# Patient Record
Sex: Female | Born: 1975 | Hispanic: Yes | Marital: Married
Health system: Southern US, Community
[De-identification: ages and names within clinical notes are randomized; demographics above are authoritative.]

---

## 2008-08-16 ENCOUNTER — Ambulatory Visit (HOSPITAL_COMMUNITY): Admission: RE | Admit: 2008-08-16 | Discharge: 2008-08-16 | Payer: Self-pay | Admitting: Obstetrics & Gynecology

## 2008-08-16 ENCOUNTER — Ambulatory Visit: Payer: Self-pay | Admitting: Obstetrics & Gynecology

## 2008-08-16 ENCOUNTER — Encounter: Admission: RE | Admit: 2008-08-16 | Discharge: 2008-10-04 | Payer: Self-pay | Admitting: Obstetrics & Gynecology

## 2008-08-30 ENCOUNTER — Ambulatory Visit: Payer: Self-pay | Admitting: Obstetrics & Gynecology

## 2008-09-06 ENCOUNTER — Ambulatory Visit: Payer: Self-pay | Admitting: Obstetrics & Gynecology

## 2008-09-10 ENCOUNTER — Ambulatory Visit (HOSPITAL_COMMUNITY): Admission: RE | Admit: 2008-09-10 | Discharge: 2008-09-10 | Payer: Self-pay | Admitting: Obstetrics & Gynecology

## 2008-09-10 IMAGING — US US OB FOLLOW-UP
1 series · 14 of 28 positions shown · non-contrast
Comparison: none

OBSTETRICAL ULTRASOUND:
 This ultrasound exam was performed in the [HOSPITAL] Ultrasound Department.  The OB US report was generated in the AS system, and faxed to the ordering physician.  This report is also available in [REDACTED] PACS.

[Series 1: us ob follow up · 14 of 34 slices shown]
[im 2/34]
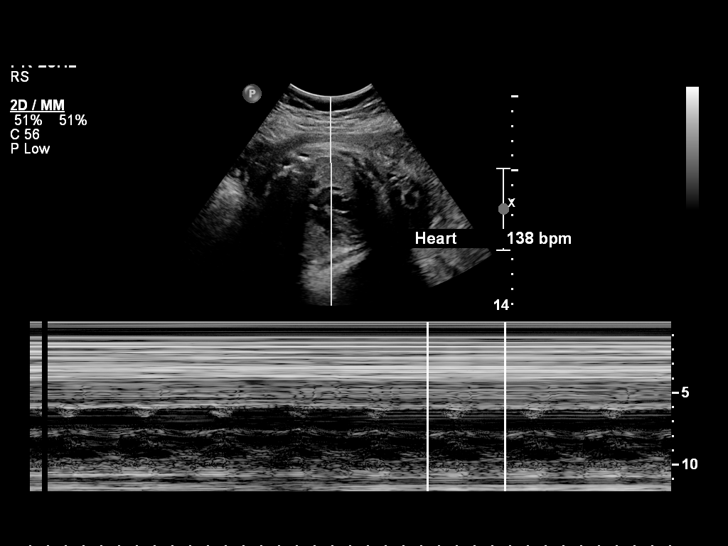
[im 4/34]
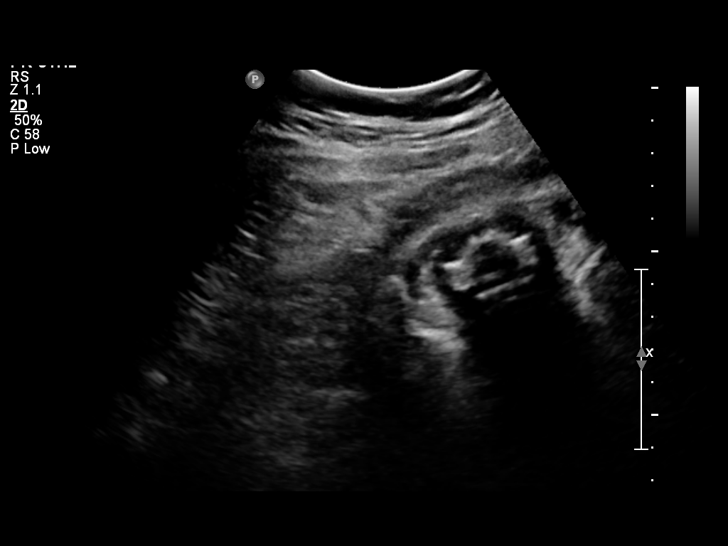
[im 7/34]
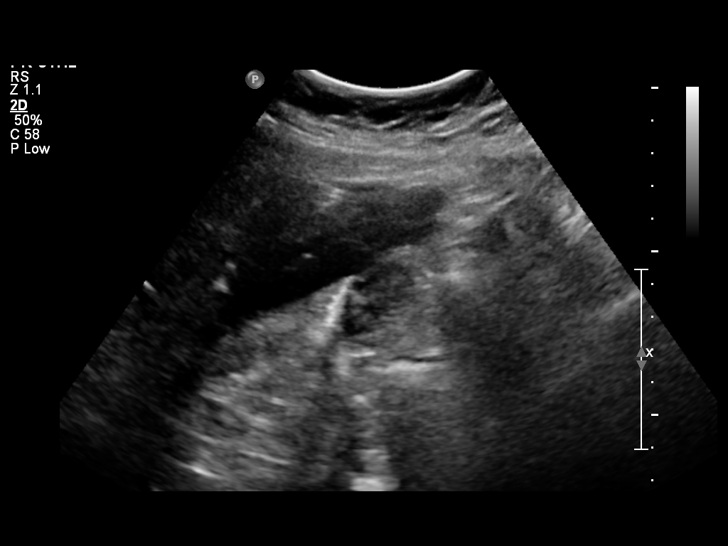
[im 9/34]
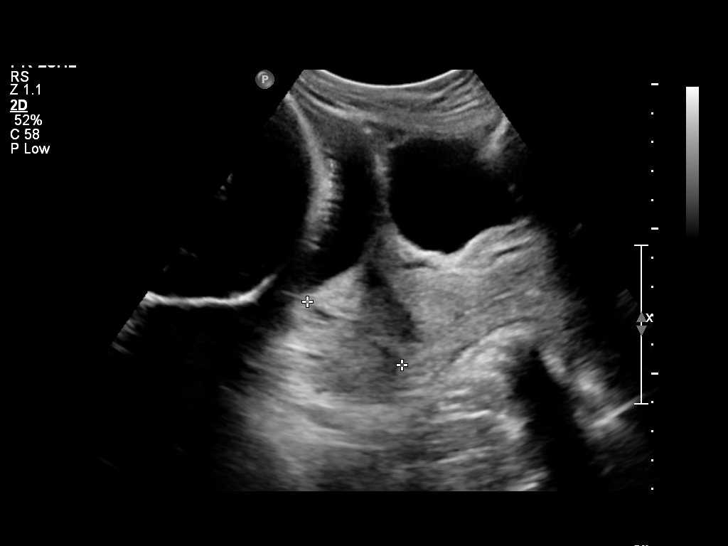
[im 12/34]
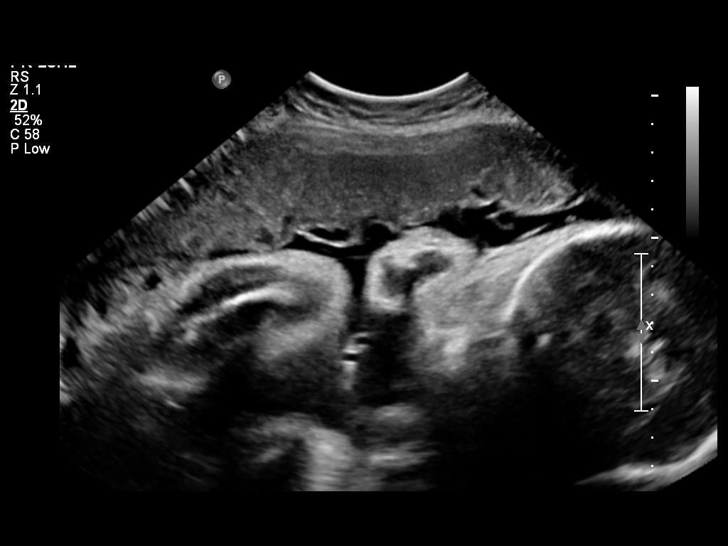
[im 14/34]
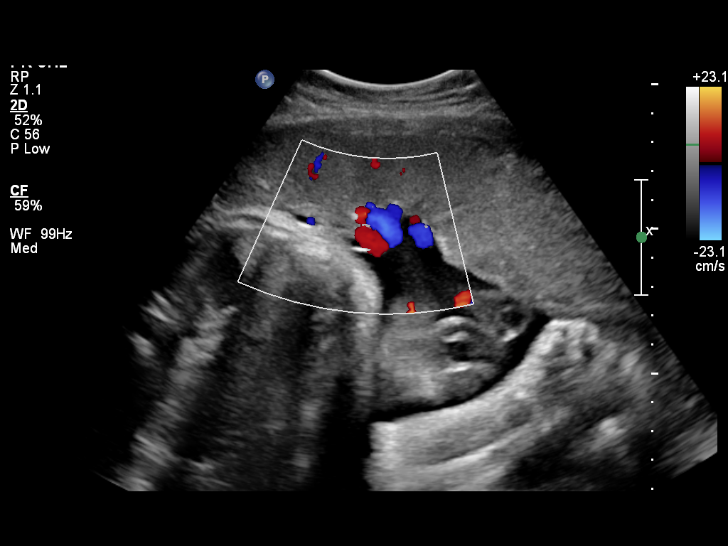
[im 16/34]
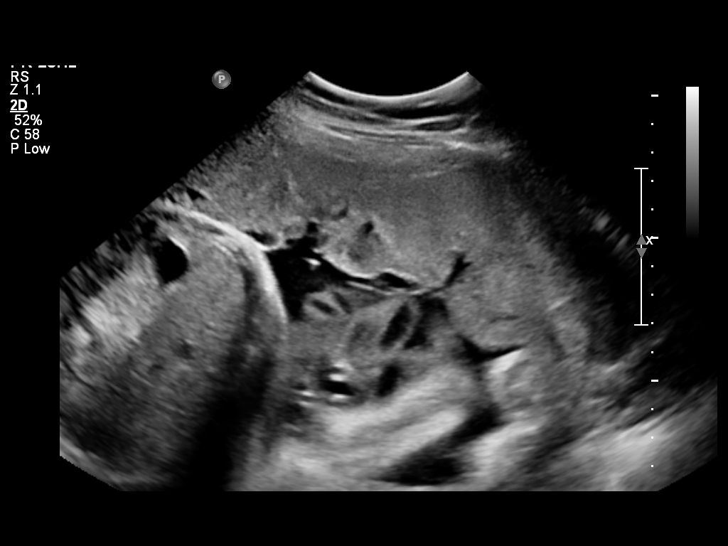
[im 19/34]
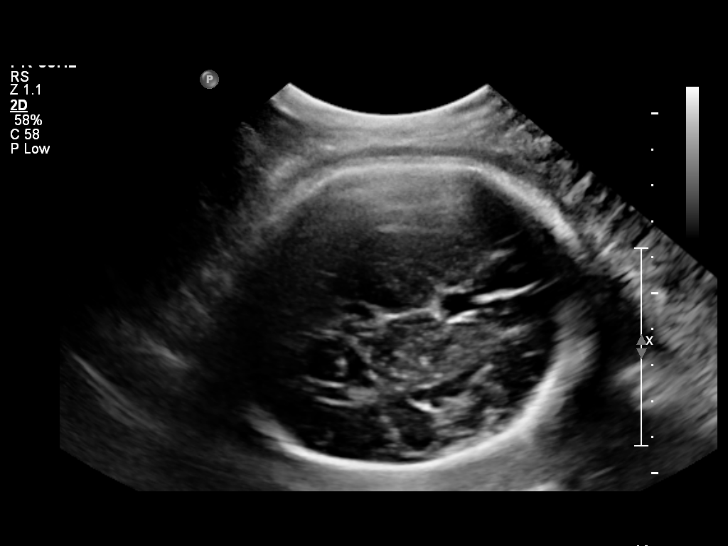
[im 21/34]
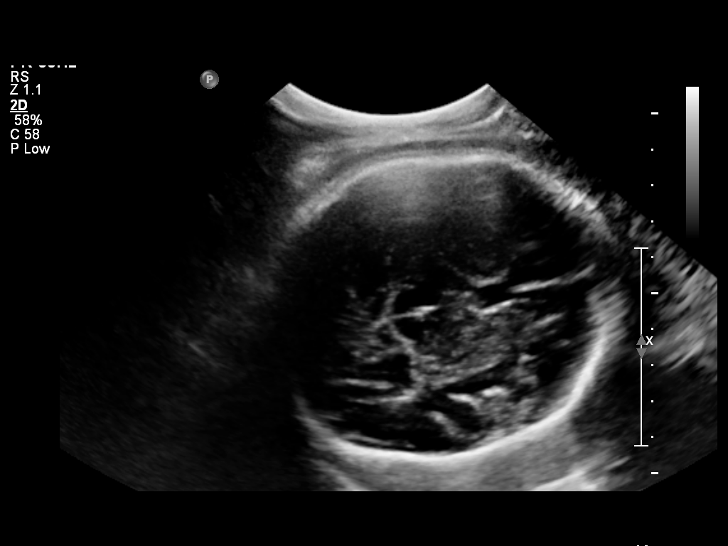
[im 24/34]
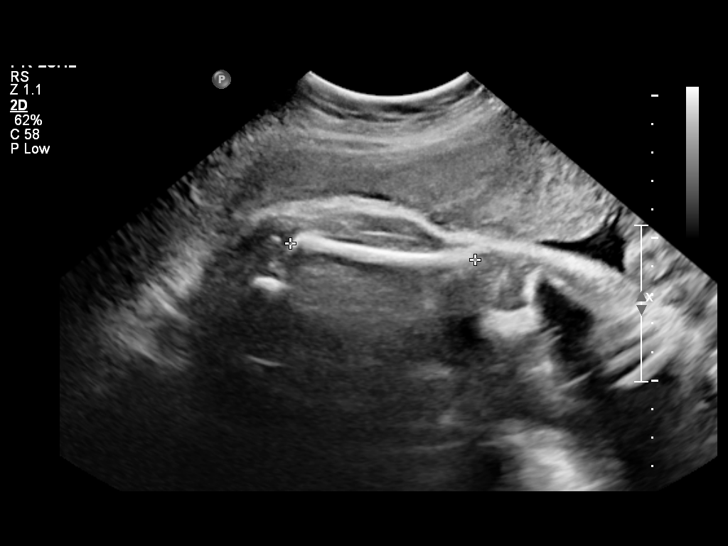
[im 26/34]
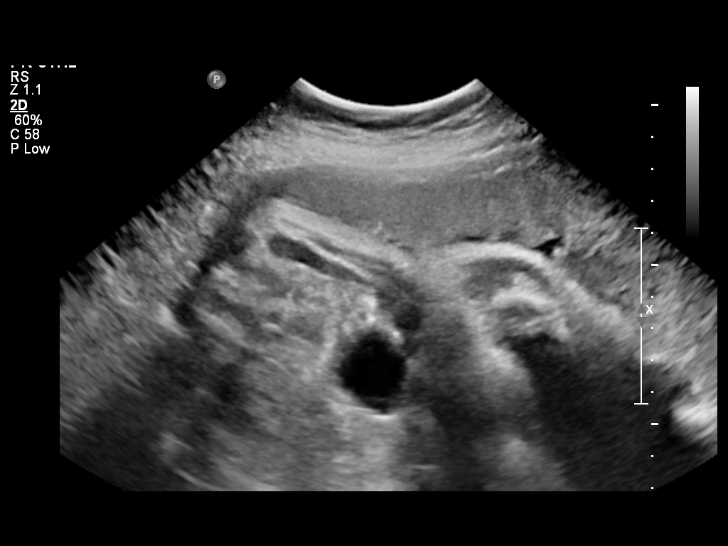
[im 29/34]
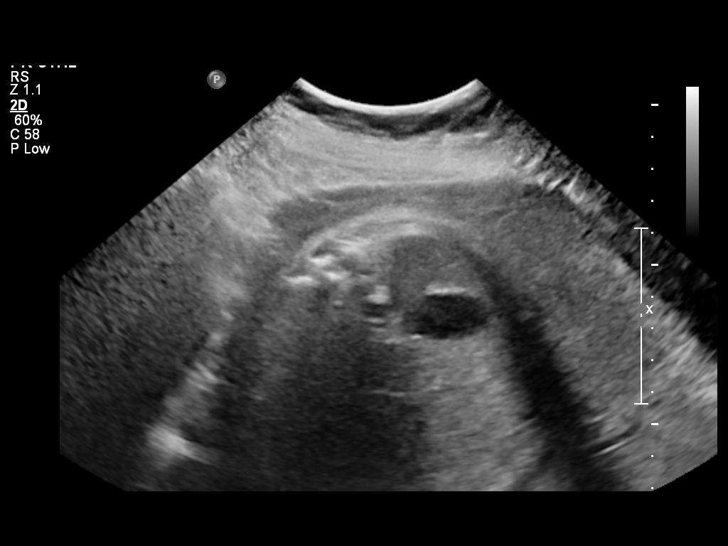
[im 31/34]
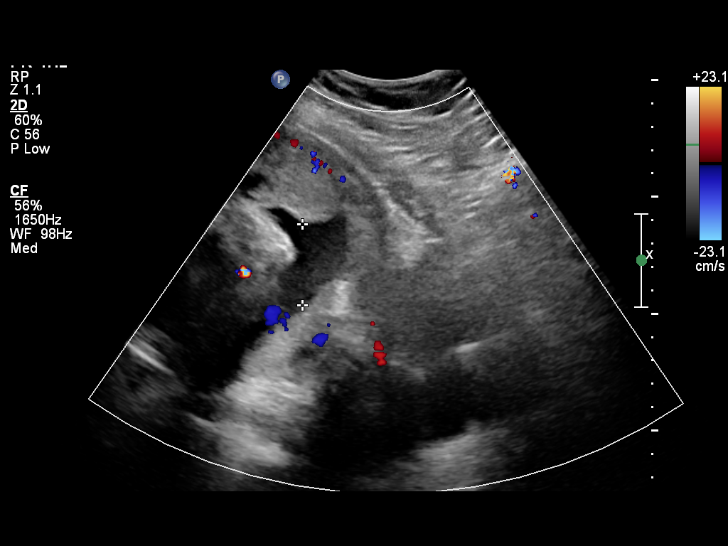
[im 34/34]
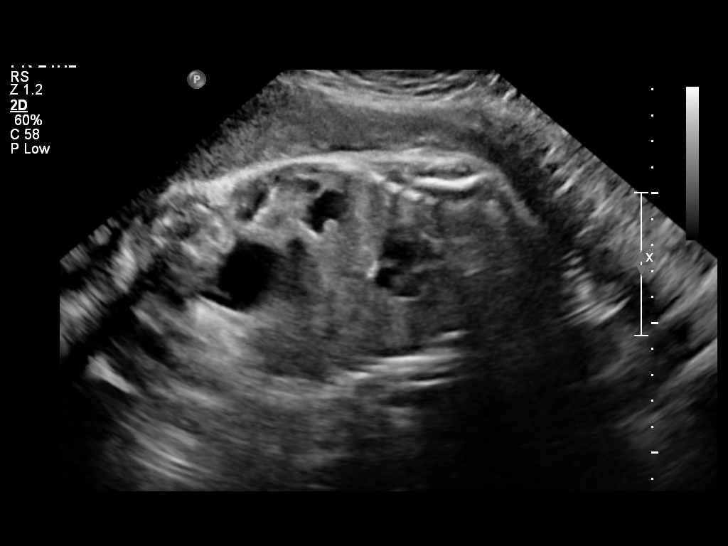

[14 of 28 positions shown; findings below may reference images not displayed]

IMPRESSION: See AS Obstetric US report.

## 2008-09-20 ENCOUNTER — Ambulatory Visit: Payer: Self-pay | Admitting: Obstetrics & Gynecology

## 2008-09-20 ENCOUNTER — Encounter: Payer: Self-pay | Admitting: Family Medicine

## 2008-09-20 LAB — CONVERTED CEMR LAB
Chlamydia, DNA Probe: NEGATIVE
GC Probe Amp, Genital: NEGATIVE

## 2008-09-27 ENCOUNTER — Ambulatory Visit: Payer: Self-pay | Admitting: Obstetrics & Gynecology

## 2008-10-04 ENCOUNTER — Ambulatory Visit: Payer: Self-pay | Admitting: Obstetrics & Gynecology

## 2008-10-04 ENCOUNTER — Encounter: Payer: Self-pay | Admitting: Family

## 2008-10-04 LAB — CONVERTED CEMR LAB
Platelets: 288 10*3/uL (ref 150–400)
RDW: 15.2 % (ref 11.5–15.5)

## 2008-10-14 ENCOUNTER — Ambulatory Visit: Payer: Self-pay | Admitting: Obstetrics & Gynecology

## 2008-10-21 ENCOUNTER — Ambulatory Visit: Payer: Self-pay | Admitting: Advanced Practice Midwife

## 2008-10-21 ENCOUNTER — Inpatient Hospital Stay (HOSPITAL_COMMUNITY): Admission: AD | Admit: 2008-10-21 | Discharge: 2008-10-23 | Payer: Self-pay | Admitting: Obstetrics and Gynecology

## 2010-05-12 LAB — GLUCOSE, CAPILLARY: Glucose-Capillary: 156 mg/dL — ABNORMAL HIGH (ref 70–99)

## 2010-05-12 LAB — POCT URINALYSIS DIP (DEVICE)
Bilirubin Urine: NEGATIVE
Glucose, UA: 100 mg/dL — AB
Nitrite: NEGATIVE
Urobilinogen, UA: 0.2 mg/dL (ref 0.0–1.0)
pH: 5.5 (ref 5.0–8.0)

## 2010-05-12 LAB — CBC
MCHC: 32.6 g/dL (ref 30.0–36.0)
Platelets: 311 10*3/uL (ref 150–400)
RBC: 3.62 MIL/uL — ABNORMAL LOW (ref 3.87–5.11)
WBC: 10.1 10*3/uL (ref 4.0–10.5)

## 2010-05-12 LAB — RPR: RPR Ser Ql: NONREACTIVE

## 2010-05-13 LAB — POCT URINALYSIS DIP (DEVICE)
Bilirubin Urine: NEGATIVE
Bilirubin Urine: NEGATIVE
Glucose, UA: NEGATIVE mg/dL
Glucose, UA: NEGATIVE mg/dL
Glucose, UA: NEGATIVE mg/dL
Ketones, ur: NEGATIVE mg/dL
Ketones, ur: NEGATIVE mg/dL
Nitrite: NEGATIVE
Nitrite: NEGATIVE
Nitrite: NEGATIVE
Nitrite: NEGATIVE
Protein, ur: NEGATIVE mg/dL
Protein, ur: NEGATIVE mg/dL
Specific Gravity, Urine: 1.02 (ref 1.005–1.030)
Urobilinogen, UA: 0.2 mg/dL (ref 0.0–1.0)
Urobilinogen, UA: 0.2 mg/dL (ref 0.0–1.0)
Urobilinogen, UA: 0.2 mg/dL (ref 0.0–1.0)

## 2010-05-14 LAB — POCT URINALYSIS DIP (DEVICE)
Glucose, UA: NEGATIVE mg/dL
Glucose, UA: NEGATIVE mg/dL
Hgb urine dipstick: NEGATIVE
Ketones, ur: NEGATIVE mg/dL
Nitrite: NEGATIVE
Specific Gravity, Urine: 1.025 (ref 1.005–1.030)
Specific Gravity, Urine: 1.025 (ref 1.005–1.030)
Urobilinogen, UA: 1 mg/dL (ref 0.0–1.0)

## 2020-08-18 ENCOUNTER — Inpatient Hospital Stay: Payer: Self-pay | Attending: Hematology and Oncology | Admitting: Hematology and Oncology

## 2020-08-18 ENCOUNTER — Other Ambulatory Visit: Payer: Self-pay

## 2020-08-18 VITALS — BP 126/79 | HR 72 | Temp 98.4°F | Resp 16 | Ht 62.0 in | Wt 161.7 lb

## 2020-08-18 DIAGNOSIS — N644 Mastodynia: Secondary | ICD-10-CM

## 2020-08-18 NOTE — Progress Notes (Signed)
Cynthia Livingston is a 45 y.o. female who presents to [facility] clinic today with complaint of breast pain.    Pap Smear: Pap not smear completed today. Last Pap smear was 5 months ago at [facility] clinic and was normal. Per patient has no history of an abnormal Pap smear. Last Pap smear result is not available in Epic.   Physical exam: Breasts Breasts symmetrical. No skin abnormalities bilateral breasts. No nipple retraction bilateral breasts. No nipple discharge bilateral breasts. No lymphadenopathy. No lumps palpated bilateral breasts.       Pelvic/Bimanual Pap is not indicated today    Smoking History: Patient has never smoked and was not referred to quit line.    Patient Navigation: Patient education provided. Access to services provided for patient through [facility] program. No interpreter provided. No transportation provided   Colorectal Cancer Screening: Per patient has never had colonoscopy completed No complaints today.    Breast and Cervical Cancer Risk Assessment: Patient has family history of breast cancer, known genetic mutations, or radiation treatment to the chest before age 47. Patient has history of cervical dysplasia, immunocompromised, or DES exposure in-utero.  Risk Assessment   No risk assessment data     A: BCCCP exam without pap smear Complaint of left breast pain  P: [facility] mamm[facility]. Appointment scheduled and patient will be notified.  Pascal Lux, NP 08/18/2020 3:52 PM

## 2020-08-30 ENCOUNTER — Other Ambulatory Visit: Payer: Self-pay | Admitting: Obstetrics and Gynecology

## 2020-09-01 LAB — FECAL OCCULT BLOOD, IMMUNOCHEMICAL: Fecal Occult Bld: NEGATIVE

## 2020-09-01 LAB — SPECIMEN STATUS REPORT
